# Patient Record
Sex: Female | Born: 1965
Health system: Southern US, Community
[De-identification: ages and names within clinical notes are randomized; demographics above are authoritative.]

## PROBLEM LIST (undated history)

## (undated) DIAGNOSIS — C50919 Malignant neoplasm of unspecified site of unspecified female breast: Secondary | ICD-10-CM

## (undated) DIAGNOSIS — I2699 Other pulmonary embolism without acute cor pulmonale: Secondary | ICD-10-CM

## (undated) DIAGNOSIS — I82409 Acute embolism and thrombosis of unspecified deep veins of unspecified lower extremity: Secondary | ICD-10-CM

## (undated) DIAGNOSIS — I10 Essential (primary) hypertension: Secondary | ICD-10-CM

## (undated) DIAGNOSIS — D219 Benign neoplasm of connective and other soft tissue, unspecified: Secondary | ICD-10-CM

## (undated) HISTORY — PX: BREAST SURGERY: SHX581

## (undated) HISTORY — DX: Benign neoplasm of connective and other soft tissue, unspecified: D21.9

## (undated) HISTORY — DX: Acute embolism and thrombosis of unspecified deep veins of unspecified lower extremity: I82.409

## (undated) HISTORY — DX: Other pulmonary embolism without acute cor pulmonale: I26.99

## (undated) HISTORY — DX: Essential (primary) hypertension: I10

## (undated) HISTORY — PX: MASTECTOMY: SHX3

## (undated) HISTORY — PX: BREAST LUMPECTOMY: SHX2

## (undated) HISTORY — DX: Malignant neoplasm of unspecified site of unspecified female breast: C50.919

---

## 2003-11-17 ENCOUNTER — Inpatient Hospital Stay: Payer: Self-pay

## 2003-11-17 DIAGNOSIS — I10 Essential (primary) hypertension: Secondary | ICD-10-CM

## 2006-12-23 ENCOUNTER — Ambulatory Visit: Payer: Self-pay

## 2007-10-21 ENCOUNTER — Ambulatory Visit: Payer: Self-pay | Admitting: General Surgery

## 2007-10-21 ENCOUNTER — Other Ambulatory Visit: Payer: Self-pay

## 2007-10-28 ENCOUNTER — Ambulatory Visit: Payer: Self-pay | Admitting: General Surgery

## 2007-11-08 ENCOUNTER — Ambulatory Visit: Payer: Self-pay | Admitting: General Surgery

## 2007-11-10 ENCOUNTER — Ambulatory Visit: Payer: Self-pay | Admitting: Radiation Oncology

## 2007-11-21 ENCOUNTER — Ambulatory Visit: Payer: Self-pay | Admitting: Radiation Oncology

## 2007-12-11 ENCOUNTER — Ambulatory Visit: Payer: Self-pay | Admitting: Radiation Oncology

## 2008-02-10 DIAGNOSIS — I82409 Acute embolism and thrombosis of unspecified deep veins of unspecified lower extremity: Secondary | ICD-10-CM

## 2008-02-10 DIAGNOSIS — I2699 Other pulmonary embolism without acute cor pulmonale: Secondary | ICD-10-CM

## 2008-02-10 DIAGNOSIS — C50919 Malignant neoplasm of unspecified site of unspecified female breast: Secondary | ICD-10-CM | POA: Insufficient documentation

## 2008-02-10 HISTORY — DX: Acute embolism and thrombosis of unspecified deep veins of unspecified lower extremity: I82.409

## 2008-02-10 HISTORY — PX: ABDOMINAL SURGERY: SHX537

## 2008-02-10 HISTORY — DX: Malignant neoplasm of unspecified site of unspecified female breast: C50.919

## 2008-02-10 HISTORY — DX: Other pulmonary embolism without acute cor pulmonale: I26.99

## 2008-08-30 ENCOUNTER — Ambulatory Visit: Payer: Self-pay | Admitting: Family Medicine

## 2009-01-29 IMAGING — NM NM SENTINAL NODE INJECTION (BREAST) - NO REPORT
1 series · 2 of 2 positions shown · non-contrast
Comparison: none

REASON FOR EXAM: LEFT BREAST CA
SURG AT [DATE]       [DATE]  34841
COMMENTS:

[Series 1000: sent node breast static · 2.40mm/px · 2 of 2 frames shown]
[frame 1/2]
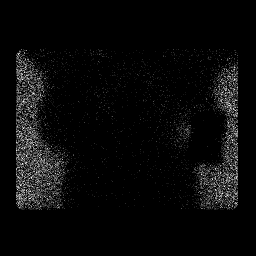
[frame 2/2]
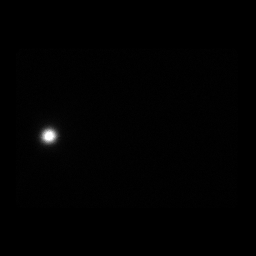

[2 of 2 positions shown; findings below may reference images not displayed]

PROCEDURE:     NM  - NM SENTINEL NODE  BREAST  - October 28, 2007  [DATE]

RESULT:     The LEFT breast is sterilely prepped and draped. Following local
anesthesia with 1% Lidocaine, 4.14 mCi of Technetium 99m Sulfur Colloid was
injected into the LEFT periareolar region for sentinel node study. There
were no complications.
IMPRESSION: Successful injection for sentinel node study.

## 2009-12-02 IMAGING — US US EXTREM LOW VENOUS BILAT
1 series · 17 of 24 positions shown · non-contrast
Comparison: none

REASON FOR EXAM: DVT left calf hx of pulmonary embolis PT takes Coumadin
COMMENTS:

[Series 1: us extrem low venous bilat · 17 of 40 slices shown]
[im 1/40]
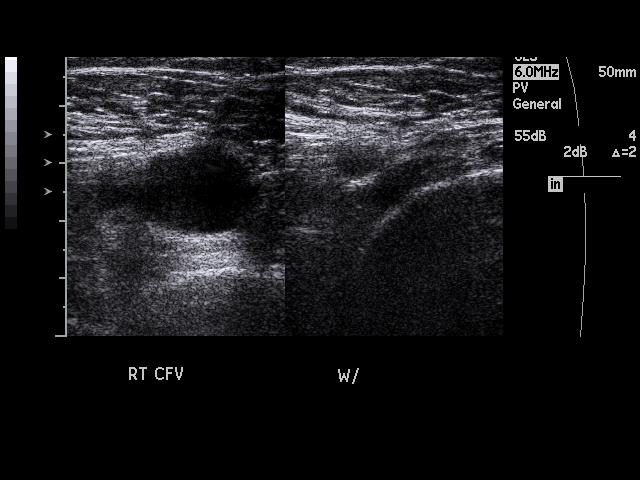
[im 4/40]
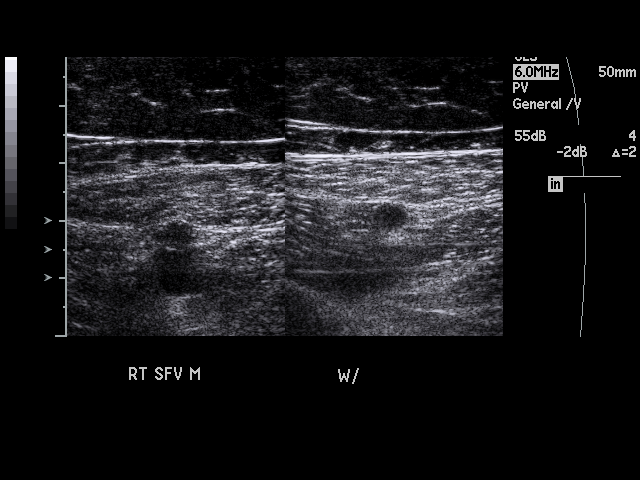
[im 6/40]
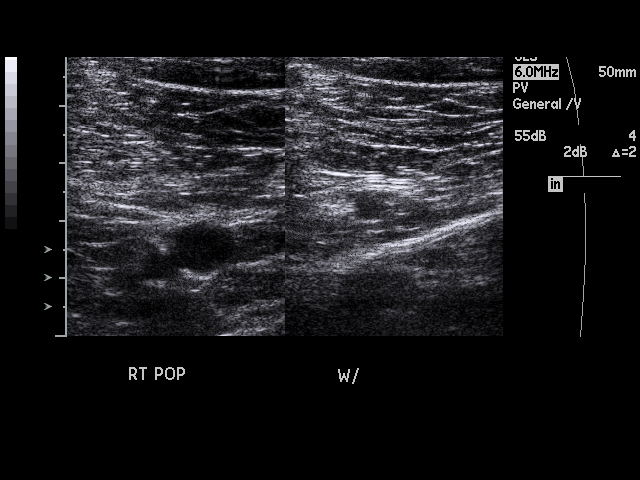
[im 7/40]
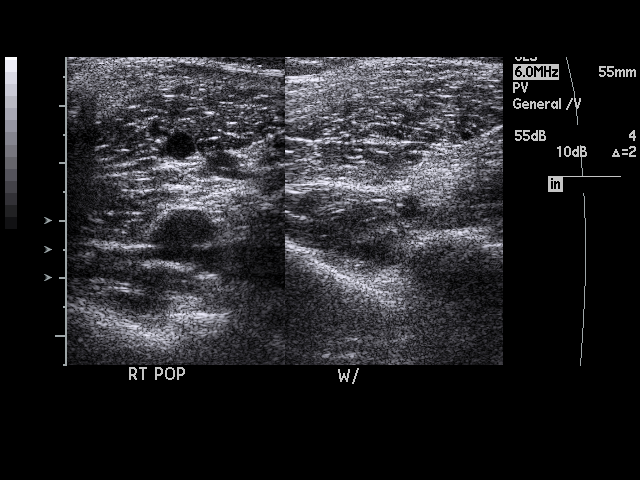
[im 11/40]
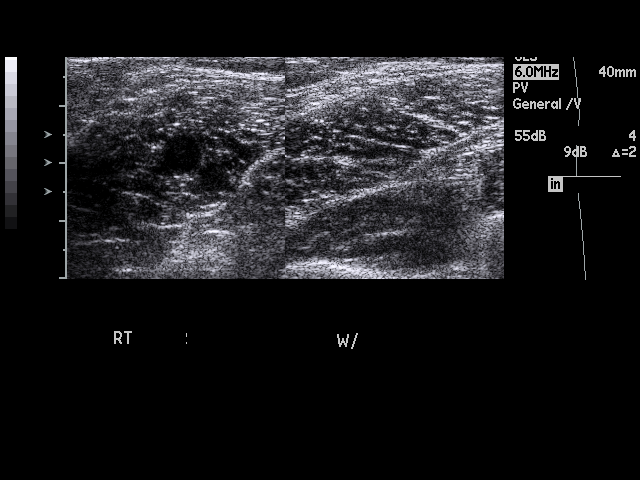
[im 12/40]
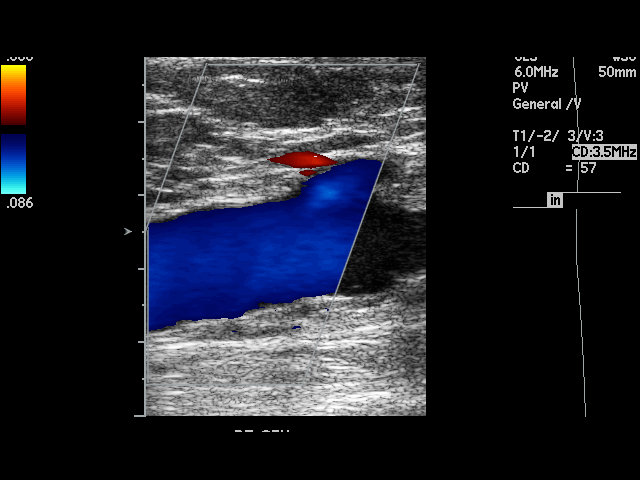
[im 16/40]
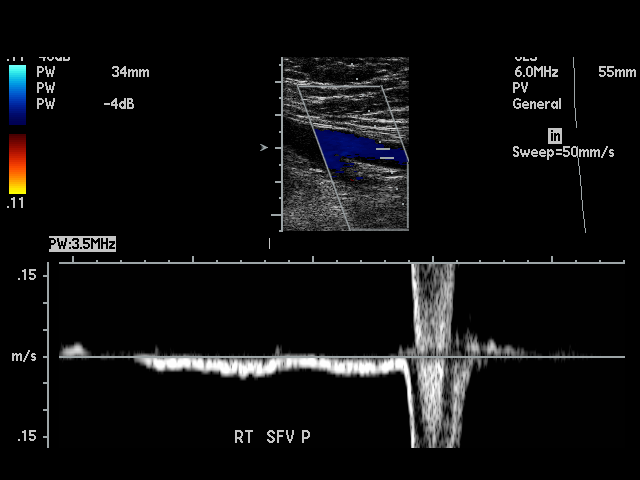
[im 17/40]
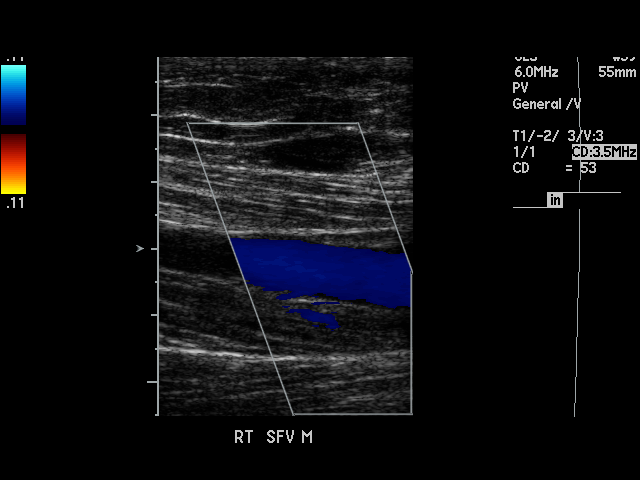
[im 21/40]
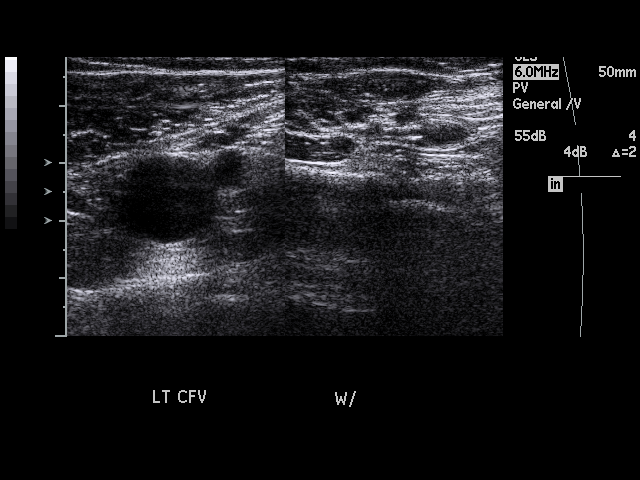
[im 23/40]
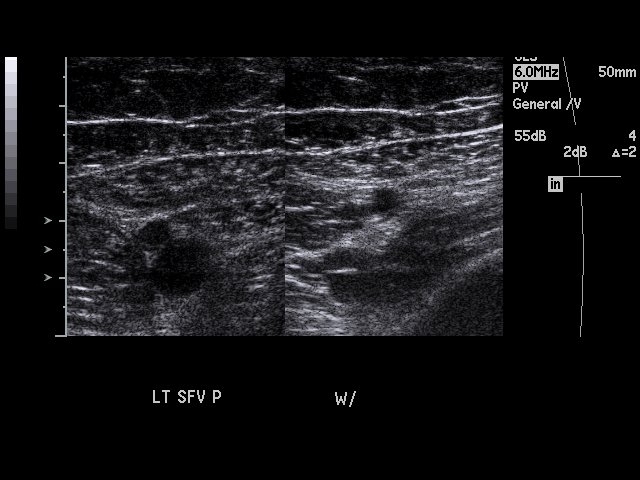
[im 24/40]
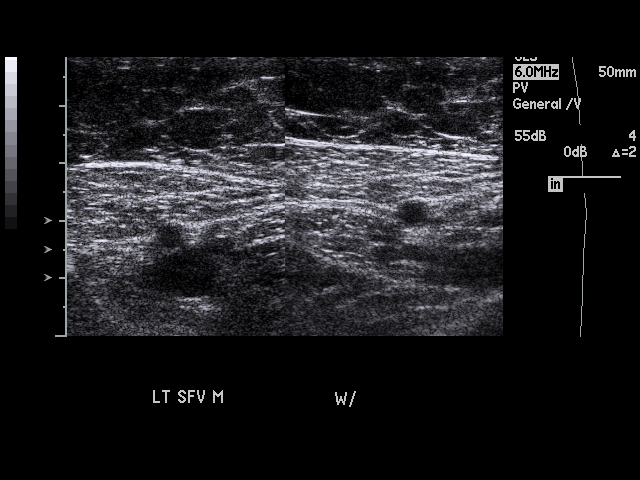
[im 28/40]
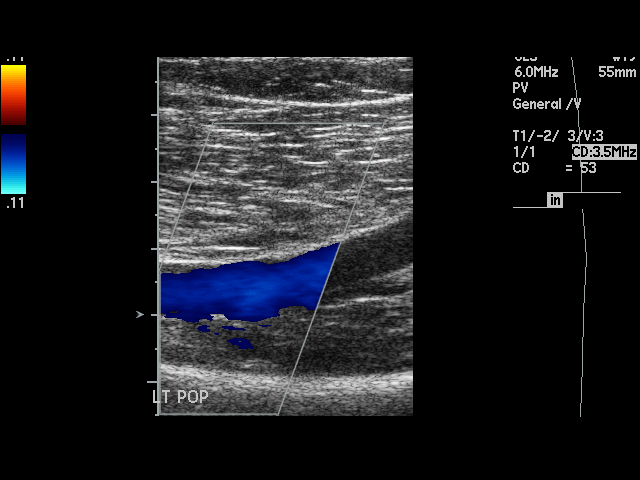
[im 29/40]
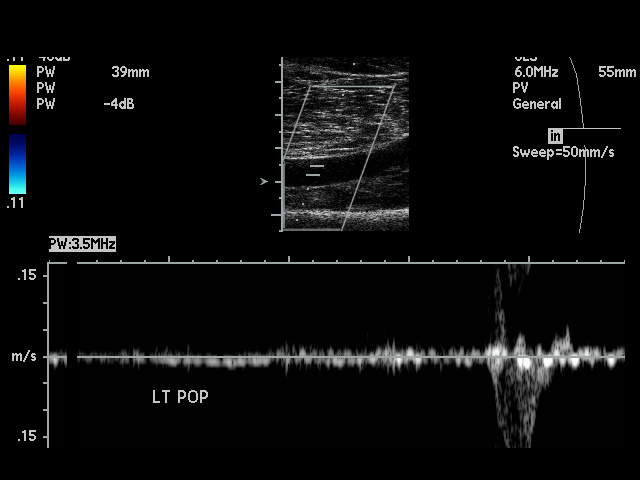
[im 33/40]
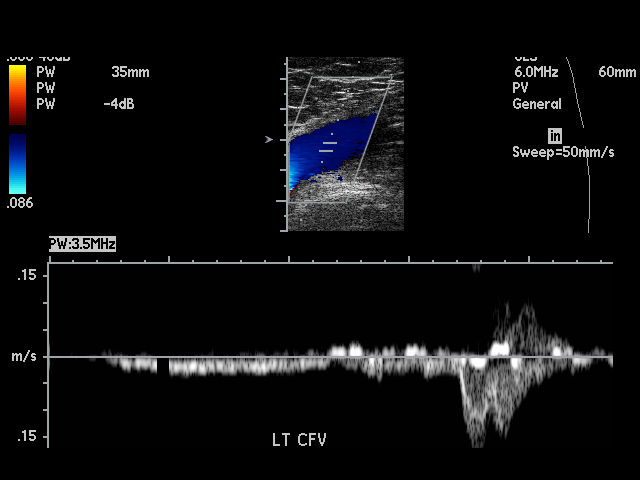
[im 34/40]
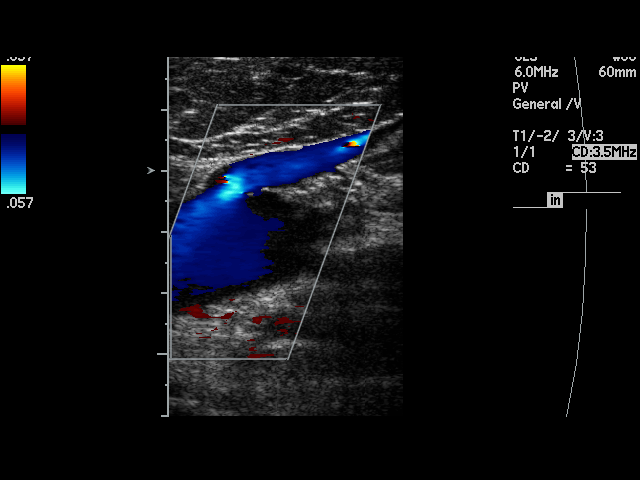
[im 36/40]
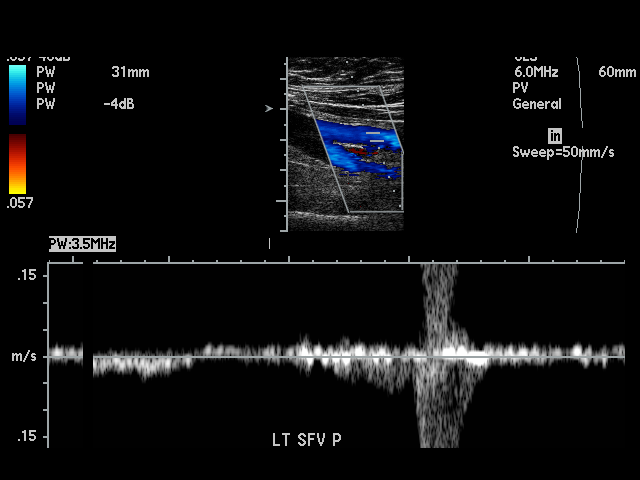
[im 40/40]
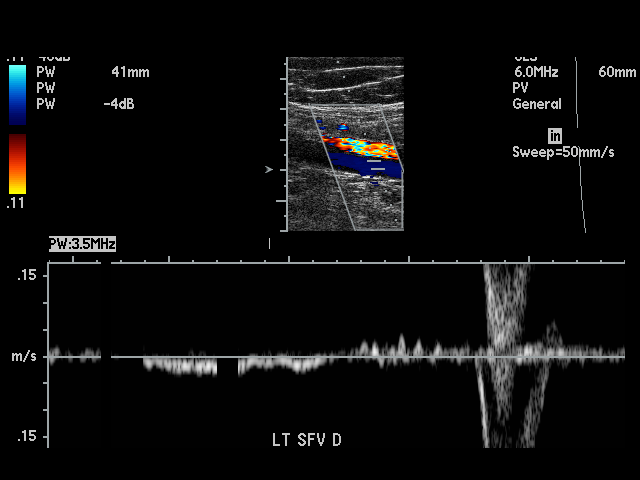

[17 of 24 positions shown; findings below may reference images not displayed]

PROCEDURE:     US  - US DOPPLER LOW EXTR BILATERAL  - August 30, 2008 [DATE]

RESULT:     The augmentation flow waveforms are normal in appearance
bilaterally. The femoral and popliteal veins bilaterally show complete
compressibility throughout their course. Bilateral Doppler examination shows
no occlusion or evidence of deep vein thrombosis.
IMPRESSION: Bilaterally normal study. No deep vein thrombosis is identified on either
side.

## 2017-06-01 ENCOUNTER — Ambulatory Visit (INDEPENDENT_AMBULATORY_CARE_PROVIDER_SITE_OTHER): Payer: 59 | Admitting: Certified Nurse Midwife

## 2017-06-01 ENCOUNTER — Encounter: Payer: Self-pay | Admitting: Certified Nurse Midwife

## 2017-06-01 VITALS — BP 138/86 | HR 88 | Ht 64.5 in | Wt 182.0 lb

## 2017-06-01 DIAGNOSIS — Z01419 Encounter for gynecological examination (general) (routine) without abnormal findings: Secondary | ICD-10-CM | POA: Diagnosis not present

## 2017-06-01 DIAGNOSIS — D259 Leiomyoma of uterus, unspecified: Secondary | ICD-10-CM

## 2017-06-01 DIAGNOSIS — Z124 Encounter for screening for malignant neoplasm of cervix: Secondary | ICD-10-CM | POA: Diagnosis not present

## 2017-06-01 NOTE — Progress Notes (Signed)
Gynecology Annual Exam  TIR:[id number] Sanjuan Dame Chief Complaint:  Chief Complaint  Patient presents with  . Gynecologic Exam    History of Present Illness:Danielle Forbes is a 52 year old African American/Black female, [name], who presents for her gyn exam. She is having no significant GYN problems.  Her menses have become more irregular over the past year and her LMP was in March . They occur every other month , they last 4-5 days , are medium flow with 1-2 heavy days requiring pad change 4-5x/day.  She has had no intermenstrual spotting.  She denies dysmenorrhea.. c  The patient's past medical history is notable for a history of left breast cancer (DCIS), left mastectomy with breast reconstruction with abdominal flap which failed and later with implant. She also has a hx of DVT and PE surrounding the surgery for her breast cancer and  hypertension She also has a large fibroid uterus and her last pelvic ultrasound in Dec 2015 showed the fibroids were basically stable: 2.66 cm ant LUS, 8.39cm in post LUS, 6.2cm in superior fundus, and a 2.5 cm fibroid on the right side. After her last annual GYN exam dated 12/19/2013, she has gained 15#. She is sexually active. She is currently using a vasectomy for contraception.  Her most recent pap smear was obtained 12/19/13 and was negative  Her most recent right mammogram obtained on at [facility] 08/03/2016 was benign There is no other family history of breast cancer.  There is no family history of ovarian cancer.  The patient does do monthly self breast exams.  The patient does not smoke.  The patient does not drink alcohol.  The patient does not use illegal drugs.  The patient does exercise by doing yardwork and mowing lawns  The patient does get adequate calcium in her diet and with her supplement  She had a recent cholesterol screen in 2018 that was borderline        Review of Systems: Review of Systems  Constitutional: Negative for  chills, fever and weight loss.  HENT: Negative for congestion, sinus pain and sore throat.   Eyes: Positive for blurred vision. Negative for pain.  Respiratory: Negative for hemoptysis, shortness of breath and wheezing.   Cardiovascular: Negative for chest pain, palpitations and leg swelling.  Gastrointestinal: Negative for abdominal pain, blood in stool, diarrhea, heartburn, nausea and vomiting.  Genitourinary: Negative for dysuria, frequency, hematuria and urgency.       Positive for irregular menses  Musculoskeletal: Negative for back pain, joint pain and myalgias.  Skin: Negative for itching and rash.  Neurological: Negative for dizziness, tingling and headaches.  Endo/Heme/Allergies: Negative for environmental allergies and polydipsia. Does not bruise/bleed easily.       Negative for hirsutism   Psychiatric/Behavioral: Negative for depression. The patient is not nervous/anxious and does not have insomnia.     Past Medical History:  Past Medical History:  Diagnosis Date  . Breast cancer ([place]) 02/2008   left breast DCIS  . DVT (deep venous thrombosis) ([place]) 02/2008  . Fibroids    leiomyoma of uterus  . Hypertension   . Pulmonary embolism ([place]) 02/2008    Past Surgical History:  Past Surgical History:  Procedure Laterality Date  . ABDOMINAL SURGERY  2010   abdominal abscess I&D after failed abdominal flap reconstruction  . BREAST LUMPECTOMY Left 02/2008 and 03/10/2011  . BREAST SURGERY     mammoplasty on right and implant on left breast  .  MASTECTOMY  left   with breast reconstruction    Family History:  Family History  Problem Relation Age of Onset  . Alcoholism Mother   . Diabetes Father   . Hypertension Father   . Diabetes Paternal Grandmother   . Hypertension Paternal Grandmother   . Diabetes Paternal Aunt   . Diabetes Paternal Aunt   . Hypertension Maternal Aunt   . Kidney failure Maternal Aunt 80    Social History:  Social History   Socioeconomic  History  . Marital status: Married    Spouse name: Not on file  . Number of children: 2  . Years of education: Not on file  . Highest education level: Not on file  Occupational History  . Occupation: Peer support  Social Needs  . Financial resource strain: Not on file  . Food insecurity:    Worry: Not on file    Inability: Not on file  . Transportation needs:    Medical: Not on file    Non-medical: Not on file  Tobacco Use  . Smoking status: Never Smoker  . Smokeless tobacco: Never Used  Substance and Sexual Activity  . Alcohol use: Never    Frequency: Never  . Drug use: Never  . Sexual activity: Yes    Partners: Male    Birth control/protection: None    Comment: vasectomy  Lifestyle  . Physical activity:    Days per week: 7 days    Minutes per session: 60 min  . Stress: Only a little  Relationships  . Social connections:    Talks on phone: Not on file    Gets together: Not on file    Attends religious service: Not on file    Active member of club or organization: Not on file    Attends meetings of clubs or organizations: Not on file    Relationship status: Not on file  . Intimate partner violence:    Fear of current or ex partner: Not on file    Emotionally abused: Not on file    Physically abused: Not on file    Forced sexual activity: Not on file  Other Topics Concern  . Not on file  Social History Narrative  . Not on file    Allergies:  No Known Allergies  Medications: Prior to Admission medications   Medication Sig Start Date End Date Taking? Authorizing Provider  ALPRAZolam (XANAX) 0.5 MG tablet TAKE 1 TABLET (0.5 MG TOTAL) BY MOUTH 2 (TWO) TIMES DAILY AS NEEDED FOR ANXIETY (1/2-1) 05/19/17  Yes [provider]  amLODipine (NORVASC) 5 MG tablet Take 5 mg by mouth daily. 05/19/17  Yes [provider]  Biotin 5 MG CAPS Take by mouth. 07/29/11  Yes [provider]  Calcium Carbonate-Vitamin D 600-400 MG-UNIT tablet Take by mouth.    Yes [provider]  Cholecalciferol (VITAMIN D3) 1000 units CAPS Take by mouth.   Yes [provider]  folic acid (FOLVITE) 1 MG tablet Take by mouth.   Yes [provider]  Multiple Vitamin (MULTI-VITAMINS) TABS Take by mouth.   Yes [provider]  triamterene-hydrochlorothiazide (MAXZIDE-25) 37.5-25 MG tablet Take 1 tablet by mouth daily. 03/13/17  Yes [provider]  zolpidem (AMBIEN) 5 MG tablet TAKE 1 TABLET (5 MG TOTAL) BY MOUTH NIGHTLY AS NEEDED FOR SLEEP 05/19/17  Yes [provider]    Physical Exam Vitals:BP 138/86   Pulse 88   Ht 5' 4.5" (1.638 m)   Wt 182 lb (  82.6 kg)   LMP 04/10/2017 (Approximate)   BMI 30.76 kg/m     General: BF in NAD HEENT: normocephalic, anicteric Neck: no thyroid enlargement, no palpable nodules, no cervical lymphadenopathy  Pulmonary: No increased work of breathing, CTAB Cardiovascular: RRR, without murmur  Breast: s/p breast reduction on right and mastectomy with implant on left. No masses palpated. Breasts non tender. No axillary, infraclavicular or supraclavicular lymphadenopathy. Abdomen: Soft, non-tender, non-distended.  Umbilicus without lesions.  No hepatomegaly. No evidence of hernia. Well healed scar on lower abdomen present. Genitourinary:  External: Normal external female genitalia.  Normal urethral meatus, normal  Bartholin's and Skene's glands.    Vagina: Normal vaginal mucosa, no evidence of prolapse.    Cervix: Grossly normal in appearance, no bleeding, non-tender  Uterus: enlarged irregular uterus extending into abdomen, immobile, NT  Adnexa: unable to assess due to large immobile uterus  Rectal: deferred  Lymphatic: no evidence of inguinal lymphadenopathy Extremities: no edema, erythema, or tenderness Neurologic: Grossly intact Psychiatric: mood appropriate, affect full     Assessment: 52 y.o. [id number] with large fibroid uterus. Hx of breast cancer: s/p left  mastectomy  Plan:   1) Breast cancer screening - recommend monthly self breast exam and right annual screening mammogram. Due after 08/03/2016 at [facility]  2) Colon cancer screening: Her PCP ordered stool for occult blood and gave her cards to collect specimens at home  3) Cervical cancer screening - Pap was done. ASCCP guidelines and rational discussed.  Patient opts for every 3-5 years screening interval  4) Contraception - vasectomy  5) Routine healthcare maintenance including cholesterol and diabetes screening managed by PCP   6) Recommend pelvic ultrasound to check stability of fibroids and to evaluate the adnexal areas. RTO in 2-3 weeks for ultrasound and follow up.  Dalia Heading, CNM

## 2017-06-04 LAB — IGP, APTIMA HPV
HPV Aptima: NEGATIVE
PAP SMEAR COMMENT: 0

## 2017-06-06 ENCOUNTER — Encounter: Payer: Self-pay | Admitting: Certified Nurse Midwife

## 2017-06-06 DIAGNOSIS — D219 Benign neoplasm of connective and other soft tissue, unspecified: Secondary | ICD-10-CM | POA: Insufficient documentation

## 2017-06-06 DIAGNOSIS — I10 Essential (primary) hypertension: Secondary | ICD-10-CM | POA: Insufficient documentation

## 2017-06-14 ENCOUNTER — Ambulatory Visit (INDEPENDENT_AMBULATORY_CARE_PROVIDER_SITE_OTHER): Payer: 59 | Admitting: Certified Nurse Midwife

## 2017-06-14 ENCOUNTER — Encounter: Payer: Self-pay | Admitting: Certified Nurse Midwife

## 2017-06-14 ENCOUNTER — Other Ambulatory Visit: Payer: Self-pay | Admitting: Certified Nurse Midwife

## 2017-06-14 ENCOUNTER — Ambulatory Visit (INDEPENDENT_AMBULATORY_CARE_PROVIDER_SITE_OTHER): Payer: 59

## 2017-06-14 VITALS — BP 132/84 | HR 83 | Ht 64.5 in | Wt 190.0 lb

## 2017-06-14 DIAGNOSIS — D259 Leiomyoma of uterus, unspecified: Secondary | ICD-10-CM | POA: Diagnosis not present

## 2017-06-14 DIAGNOSIS — D252 Subserosal leiomyoma of uterus: Secondary | ICD-10-CM | POA: Diagnosis not present

## 2017-06-14 DIAGNOSIS — D251 Intramural leiomyoma of uterus: Secondary | ICD-10-CM | POA: Diagnosis not present

## 2017-06-14 NOTE — Progress Notes (Signed)
  HPI: 52 year old AAF, G2 P2002, who has a history of a large fibroid uterus. At the time of her annual last month her uterus was large and irregular and extended into her abdomen, was immobile and it was very difficult evaluating her adnexa. At her last pelvic ultrasound in Dec 2015, the fibroids were basically stable: 2.66 cm ant LUS, 8.39cm in post LUS, 6.2cm in superior fundus, and a 2.5 cm fibroid on the right side. Her menses are irregular (every other month) and are not heavy in flow.  On today's ultrasound:  The uterus is enlarged,  retroverted and measures 16.22 x 10.62 x 11.15cm. Echo texture is heterogenous with evidence of focal masses. Within the uterus are multiple suspected fibroids measuring: Fibroid 1:  8.01 x 6.4cm, Fundal, Intramural Fibroid 2:  3.97 x 1.73cm, Anterior fundal, intramural Fibroid 3:  6.25 x 8.8cm, posterior fundal, intramural (There are likely more than 3 fibroids, but too difficult to differentiate) Thee appears to be an echogenic mass with a single feeding artery in the cervical canal suggesting a polyp. It measures 2.13 x 0.64cm.  The Endometrium measures 5.74 mm.  Right Ovary is not normal visualized. Left Ovary  is not normal visualized. Survey of the adnexa demonstrates no adnexal masses. There is no free fluid in the cul de sac.   PMHx: She  has a past medical history of Breast cancer ([place]) (02/2008), DVT (deep venous thrombosis) ([place]) (02/2008), Fibroids, Hypertension, and Pulmonary embolism ([employer]) (02/2008). Also,  has a past surgical history that includes Abdominal surgery (2010); Breast surgery; Mastectomy (left); and Breast lumpectomy (Left, 02/2008 and 03/10/2011)., family history includes Alcoholism in her mother; Diabetes in her father, paternal aunt, paternal aunt, and paternal grandmother; Hypertension in her father, maternal aunt, and paternal grandmother; Kidney failure (age of onset: 66) in her maternal aunt.,  reports that she has never  smoked. She has never used smokeless tobacco. She reports that she does not drink alcohol or use drugs.  She has a current medication list which includes the following prescription(s): alprazolam, amlodipine, biotin, calcium carbonate-vitamin d, vitamin d3, folic acid, multi-vitamins, triamterene-hydrochlorothiazide, and zolpidem. Also, has No Known Allergies.  ROS  Objective: BP 132/84   Pulse 83   Ht 5' 4.5" (1.638 m)   Wt 190 lb (86.2 kg)   BMI 32.11 kg/m   Physical examination Constitutional NAD, Conversant     Extremities: Moves all appropriately.  Normal ROM for age.  Neuro: Grossly intact  Psych: Oriented to PPT.  Normal mood. Normal affect.  Discussed results with patient   Assessment:  Large fibroid uterus-fibroids may have grown a little larger, but are asymptomatic Cervical polyp-asymptomatic  Plan: Follow up in 1 year and prn heavy, prolonged or intermenstrual bleeding  Dalia Heading, CNM

## 2017-06-30 ENCOUNTER — Encounter: Payer: Self-pay | Admitting: Obstetrics and Gynecology

## 2019-03-21 ENCOUNTER — Other Ambulatory Visit: Payer: Self-pay | Admitting: Certified Nurse Midwife

## 2019-03-21 ENCOUNTER — Telehealth: Payer: Self-pay | Admitting: Certified Nurse Midwife

## 2019-03-21 ENCOUNTER — Ambulatory Visit: Payer: BLUE CROSS/BLUE SHIELD | Admitting: Certified Nurse Midwife

## 2019-03-21 ENCOUNTER — Other Ambulatory Visit: Payer: Self-pay

## 2019-03-21 DIAGNOSIS — D252 Subserosal leiomyoma of uterus: Secondary | ICD-10-CM

## 2019-03-21 DIAGNOSIS — D251 Intramural leiomyoma of uterus: Secondary | ICD-10-CM

## 2019-03-21 NOTE — Progress Notes (Deleted)
Gynecology Annual Exam  [id number] Sanjuan Dame Chief Complaint:  No chief complaint on file.   History of Present Illness:Danielle Forbes is a 54 year old African American/Black female, [place], who presents for her gyn exam. She is having no significant GYN problems.  Her menses have become more irregular over the past year and her LMP was in March . They occur every other month , they last 4-5 days , are medium flow with 1-2 heavy days requiring pad change 4-5x/day.  She has had no intermenstrual spotting.  She denies dysmenorrhea.. c  The patient's past medical history is notable for a history of left breast cancer (DCIS), left mastectomy with breast reconstruction with abdominal flap which failed and later with implant. She also has a hx of DVT and PE surrounding the surgery for her breast cancer and  hypertension She also has a large fibroid uterus and her last pelvic ultrasound in Dec 2015 showed the fibroids were basically stable: 2.66 cm ant LUS, 8.39cm in post LUS, 6.2cm in superior fundus, and a 2.5 cm fibroid on the right side. After her last annual GYN exam dated 12/19/2013, she has gained 15#. She is sexually active. She is currently using a vasectomy for contraception.  Her most recent pap smear was obtained 12/19/13 and was negative  Her most recent right mammogram obtained on at [facility] 08/03/2016 was benign There is no other family history of breast cancer.  There is no family history of ovarian cancer.  The patient does do monthly self breast exams.  The patient does not smoke.  The patient does not drink alcohol.  The patient does not use illegal drugs.  The patient does exercise by doing yardwork and mowing lawns  The patient does get adequate calcium in her diet and with her supplement  She had a recent cholesterol screen in 2018 that was borderline        Review of Systems: Review of Systems  Constitutional: Negative for chills, fever and weight loss.    HENT: Negative for congestion, sinus pain and sore throat.   Eyes: Positive for blurred vision. Negative for pain.  Respiratory: Negative for hemoptysis, shortness of breath and wheezing.   Cardiovascular: Negative for chest pain, palpitations and leg swelling.  Gastrointestinal: Negative for abdominal pain, blood in stool, diarrhea, heartburn, nausea and vomiting.  Genitourinary: Negative for dysuria, frequency, hematuria and urgency.       Positive for irregular menses  Musculoskeletal: Negative for back pain, joint pain and myalgias.  Skin: Negative for itching and rash.  Neurological: Negative for dizziness, tingling and headaches.  Endo/Heme/Allergies: Negative for environmental allergies and polydipsia. Does not bruise/bleed easily.       Negative for hirsutism   Psychiatric/Behavioral: Negative for depression. The patient is not nervous/anxious and does not have insomnia.     Past Medical History:  Past Medical History:  Diagnosis Date  . Breast cancer ([place]) 02/2008   left breast DCIS; 5/19 cancer genetic testing letter sent  . DVT (deep venous thrombosis) ([place]) 02/2008  . Fibroids    leiomyoma of uterus  . Hypertension   . Pulmonary embolism ([place]) 02/2008    Past Surgical History:  Past Surgical History:  Procedure Laterality Date  . ABDOMINAL SURGERY  2010   abdominal abscess I&D after failed abdominal flap reconstruction  . BREAST LUMPECTOMY Left 02/2008 and 03/10/2011  . BREAST SURGERY     mammoplasty on right and implant on left breast  .  MASTECTOMY  left   with breast reconstruction    Family History:  Family History  Problem Relation Age of Onset  . Alcoholism Mother   . Diabetes Father   . Hypertension Father   . Diabetes Paternal Grandmother   . Hypertension Paternal Grandmother   . Diabetes Paternal Aunt   . Diabetes Paternal Aunt   . Hypertension Maternal Aunt   . Kidney failure Maternal Aunt 80    Social History:  Social History    Socioeconomic History  . Marital status: Married    Spouse name: Not on file  . Number of children: 2  . Years of education: Not on file  . Highest education level: Not on file  Occupational History  . Occupation: Peer support  Tobacco Use  . Smoking status: Never Smoker  . Smokeless tobacco: Never Used  Substance and Sexual Activity  . Alcohol use: Never  . Drug use: Never  . Sexual activity: Yes    Partners: Male    Birth control/protection: None    Comment: vasectomy  Other Topics Concern  . Not on file  Social History Narrative  . Not on file   Social Determinants of Health   Financial Resource Strain:   . Difficulty of Paying Living Expenses: Not on file  Food Insecurity:   . Worried About Charity fundraiser in the Last Year: Not on file  . Ran Out of Food in the Last Year: Not on file  Transportation Needs:   . Lack of Transportation (Medical): Not on file  . Lack of Transportation (Non-Medical): Not on file  Physical Activity:   . Days of Exercise per Week: Not on file  . Minutes of Exercise per Session: Not on file  Stress:   . Feeling of Stress : Not on file  Social Connections:   . Frequency of Communication with Friends and Family: Not on file  . Frequency of Social Gatherings with Friends and Family: Not on file  . Attends Religious Services: Not on file  . Active Member of Clubs or Organizations: Not on file  . Attends Archivist Meetings: Not on file  . Marital Status: Not on file  Intimate Partner Violence:   . Fear of Current or Ex-Partner: Not on file  . Emotionally Abused: Not on file  . Physically Abused: Not on file  . Sexually Abused: Not on file    Allergies:  No Known Allergies  Medications: Prior to Admission medications   Medication Sig Start Date End Date Taking? Authorizing Provider  ALPRAZolam (XANAX) 0.5 MG tablet TAKE 1 TABLET (0.5 MG TOTAL) BY MOUTH 2 (TWO) TIMES DAILY AS NEEDED FOR ANXIETY (1/2-1) 05/19/17  Yes  [provider]  amLODipine (NORVASC) 5 MG tablet Take 5 mg by mouth daily. 05/19/17  Yes [provider]  Biotin 5 MG CAPS Take by mouth. 07/29/11  Yes [provider]  Calcium Carbonate-Vitamin D 600-400 MG-UNIT tablet Take by mouth.   Yes [provider]  Cholecalciferol (VITAMIN D3) 1000 units CAPS Take by mouth.   Yes [provider]  folic acid (FOLVITE) 1 MG tablet Take by mouth.   Yes [provider]  Multiple Vitamin (MULTI-VITAMINS) TABS Take by mouth.   Yes [provider]  triamterene-hydrochlorothiazide (MAXZIDE-25) 37.5-25 MG tablet Take 1 tablet by mouth daily. 03/13/17  Yes [provider]  zolpidem (AMBIEN) 5 MG tablet TAKE 1 TABLET (5 MG TOTAL) BY MOUTH NIGHTLY AS NEEDED FOR SLEEP 05/19/17  Yes [provider]    Physical Exam Vitals:There were no vitals taken for this visit.    General: BF in NAD HEENT: normocephalic, anicteric Neck: no thyroid enlargement, no palpable nodules, no cervical lymphadenopathy  Pulmonary: No increased work of breathing, CTAB Cardiovascular: RRR, without murmur  Breast: s/p breast reduction on right and mastectomy with implant on left. No masses palpated. Breasts non tender. No axillary, infraclavicular or supraclavicular lymphadenopathy. Abdomen: Soft, non-tender, non-distended.  Umbilicus without lesions.  No hepatomegaly. No evidence of hernia. Well healed scar on lower abdomen present. Genitourinary:  External: Normal external female genitalia.  Normal urethral meatus, normal  Bartholin's and Skene's glands.    Vagina: Normal vaginal mucosa, no evidence of prolapse.    Cervix: Grossly normal in appearance, no bleeding, non-tender  Uterus: enlarged irregular uterus extending into abdomen, immobile, NT  Adnexa: unable to assess due to large immobile uterus  Rectal: deferred  Lymphatic: no evidence of inguinal lymphadenopathy Extremities: no edema, erythema, or  tenderness Neurologic: Grossly intact Psychiatric: mood appropriate, affect full     Assessment: 54 y.o. [id number] with large fibroid uterus. Hx of breast cancer: s/p left mastectomy  Plan:   1) Breast cancer screening - recommend monthly self breast exam and right annual screening mammogram. Due after 08/03/2016 at [facility]  2) Colon cancer screening: Her PCP ordered stool for occult blood and gave her cards to collect specimens at home  3) Cervical cancer screening - Pap was done. ASCCP guidelines and rational discussed.  Patient opts for every 3-5 years screening interval  4) Contraception - vasectomy  5) Routine healthcare maintenance including cholesterol and diabetes screening managed by PCP   6) Recommend pelvic ultrasound to check stability of fibroids and to evaluate the adnexal areas. RTO in 2-3 weeks for ultrasound and follow up.  Dalia Heading, CNM

## 2019-03-21 NOTE — Telephone Encounter (Signed)
We can certainly schedule an ultrasound to assess growth with her annual. Will order

## 2019-03-21 NOTE — Telephone Encounter (Signed)
Patient has rescheduled her annual out to March due to having the Danielle Forbes which we do not accept, she is considered self pay.  She states she has had ultrasound in the past to keep an eye on issues and wants to know if she needs to schedule this along with her annual?  Please advise patient if this is the case.

## 2019-03-21 NOTE — Telephone Encounter (Signed)
Patient has rescheduled her annual out to March due to having the [place], was not aware at time of scheduling, but now aware and does want to be seen with CLG.  Patient aware 305.00 due at time of service.

## 2019-03-22 NOTE — Telephone Encounter (Signed)
Scheduled

## 2019-04-18 ENCOUNTER — Other Ambulatory Visit: Payer: BLUE CROSS/BLUE SHIELD

## 2019-04-18 ENCOUNTER — Ambulatory Visit: Payer: BLUE CROSS/BLUE SHIELD | Admitting: Certified Nurse Midwife

## 2019-04-28 ENCOUNTER — Ambulatory Visit: Payer: Self-pay | Attending: Internal Medicine

## 2019-04-28 DIAGNOSIS — Z23 Encounter for immunization: Secondary | ICD-10-CM

## 2019-04-28 NOTE — Progress Notes (Signed)
   Covid-19 Vaccination Clinic  Name:  Danielle Forbes    MRN: [id number] DOB: 1965-09-11  04/28/2019  [name] was observed post Covid-19 immunization for 15 minutes without incident. She was provided with Vaccine Information Sheet and instruction to access the V-Safe system.   [name] was instructed to call 911 with any severe reactions post vaccine: Marland Kitchen Difficulty breathing  . Swelling of face and throat  . A fast heartbeat  . A bad rash all over body  . Dizziness and weakness   Immunizations Administered    Name Date Dose VIS Date Route   Pfizer COVID-19 Vaccine 04/28/2019  5:57 PM 0.3 mL 01/20/2019 Intramuscular   Manufacturer: [place]   Lot: [id number]   [place]: [id number]

## 2019-05-19 ENCOUNTER — Ambulatory Visit: Payer: Self-pay | Attending: Internal Medicine

## 2019-05-19 DIAGNOSIS — Z23 Encounter for immunization: Secondary | ICD-10-CM

## 2019-05-19 NOTE — Progress Notes (Signed)
   Covid-19 Vaccination Clinic  Name:  Danielle Forbes    MRN: [id number] DOB: Sep 13, 1965  05/19/2019  [name] was observed post Covid-19 immunization for 15 minutes without incident. She was provided with Vaccine Information Sheet and instruction to access the V-Safe system.   [name] was instructed to call 911 with any severe reactions post vaccine: Marland Kitchen Difficulty breathing  . Swelling of face and throat  . A fast heartbeat  . A bad rash all over body  . Dizziness and weakness   Immunizations Administered    Name Date Dose VIS Date Route   Pfizer COVID-19 Vaccine 05/19/2019  4:02 PM 0.3 mL 01/20/2019 Intramuscular   Manufacturer: [place]   Lot: [id number]   [place]: [id number]

## 2021-01-30 LAB — EXTERNAL GENERIC LAB PROCEDURE: COLOGUARD: NEGATIVE

## 2021-04-29 ENCOUNTER — Other Ambulatory Visit: Payer: Self-pay

## 2021-04-29 ENCOUNTER — Ambulatory Visit (LOCAL_COMMUNITY_HEALTH_CENTER): Payer: Self-pay

## 2021-04-29 DIAGNOSIS — Z719 Counseling, unspecified: Secondary | ICD-10-CM

## 2021-04-29 DIAGNOSIS — Z23 Encounter for immunization: Secondary | ICD-10-CM

## 2021-04-29 NOTE — Progress Notes (Signed)
?  Are you feeling sick today? No ? ? ?Have you ever received a dose of COVID-19 Vaccine? [employer], [place], [place], [employer], Other) Yes ? ?If yes, which vaccine and how many doses?   Pfizer Primary series 2 doses, Pfizer monovalent booster 1 dose ? ? ?Did you bring the vaccination record card or other documentation?  Yes ? ? ?Do you have a health condition or are undergoing treatment that makes you moderately or severely immunocompromised? This would include, but not be limited to: cancer, HIV, organ transplant, immunosuppressive therapy/high-dose corticosteroids, or moderate/severe primary immunodeficiency.  No ? ?Have you received COVID-19 vaccine before or during hematopoietic cell transplant (HCT) or CAR-T-cell therapies? No ? ?Have you ever had an allergic reaction to: (This would include a severe allergic reaction or a reaction that caused hives, swelling, or respiratory distress, including wheezing.) A component of a COVID-19 vaccine or a previous dose of COVID-19 vaccine? No ? ? ?Have you ever had an allergic reaction to another vaccine (other thanCOVID-19 vaccine) or an injectable medication? (This would include a severe allergic reaction or a reaction that caused hives, swelling, or respiratory distress, including wheezing.)   No ?  ?Do you have a history of any of the following: ? ?Myocarditis or Pericarditis No ?Thrombosis with thrombocytopenia syndrome (TTS) No ?Multisystem Inflammatory Syndrome (MIS-C or MIS-A)? No ?Immune-mediate syndrome defined by thrombosis and thrombocytopenia, such as heparin--induced thrombocytopenia (HIT)  No ?Guillain-Barr? Syndrome (GBS) No ?COVID-19 disease within the past 3 months? No ?Vaccinated with monkeypox vaccine in the last 4 weeks? No ? ?[facility] and COVID card updated and copy given to patient. Ranae Palms, RN ? ? ? ?

## 2022-03-17 DIAGNOSIS — E782 Mixed hyperlipidemia: Secondary | ICD-10-CM | POA: Diagnosis not present

## 2022-03-17 DIAGNOSIS — I10 Essential (primary) hypertension: Secondary | ICD-10-CM | POA: Diagnosis not present

## 2022-03-17 DIAGNOSIS — R7303 Prediabetes: Secondary | ICD-10-CM | POA: Diagnosis not present

## 2022-03-25 DIAGNOSIS — Z1231 Encounter for screening mammogram for malignant neoplasm of breast: Secondary | ICD-10-CM | POA: Diagnosis not present

## 2022-03-25 DIAGNOSIS — E782 Mixed hyperlipidemia: Secondary | ICD-10-CM | POA: Diagnosis not present

## 2022-03-25 DIAGNOSIS — R7303 Prediabetes: Secondary | ICD-10-CM | POA: Diagnosis not present

## 2022-03-25 DIAGNOSIS — C50912 Malignant neoplasm of unspecified site of left female breast: Secondary | ICD-10-CM | POA: Diagnosis not present

## 2022-03-25 DIAGNOSIS — I10 Essential (primary) hypertension: Secondary | ICD-10-CM | POA: Diagnosis not present

## 2022-03-26 DIAGNOSIS — L089 Local infection of the skin and subcutaneous tissue, unspecified: Secondary | ICD-10-CM | POA: Diagnosis not present

## 2022-03-26 DIAGNOSIS — L668 Other cicatricial alopecia: Secondary | ICD-10-CM | POA: Diagnosis not present

## 2022-04-14 DIAGNOSIS — Z1231 Encounter for screening mammogram for malignant neoplasm of breast: Secondary | ICD-10-CM | POA: Diagnosis not present

## 2022-09-16 DIAGNOSIS — C50912 Malignant neoplasm of unspecified site of left female breast: Secondary | ICD-10-CM | POA: Diagnosis not present

## 2022-09-16 DIAGNOSIS — R8281 Pyuria: Secondary | ICD-10-CM | POA: Diagnosis not present

## 2022-09-16 DIAGNOSIS — E782 Mixed hyperlipidemia: Secondary | ICD-10-CM | POA: Diagnosis not present

## 2022-09-16 DIAGNOSIS — Z0000 Encounter for general adult medical examination without abnormal findings: Secondary | ICD-10-CM | POA: Diagnosis not present

## 2022-09-16 DIAGNOSIS — I10 Essential (primary) hypertension: Secondary | ICD-10-CM | POA: Diagnosis not present

## 2022-09-16 DIAGNOSIS — R7303 Prediabetes: Secondary | ICD-10-CM | POA: Diagnosis not present

## 2022-12-19 ENCOUNTER — Ambulatory Visit: Payer: Self-pay

## 2022-12-19 DIAGNOSIS — Z23 Encounter for immunization: Secondary | ICD-10-CM

## 2023-01-28 DIAGNOSIS — L6681 Central centrifugal cicatricial alopecia: Secondary | ICD-10-CM | POA: Diagnosis not present

## 2023-01-28 DIAGNOSIS — L658 Other specified nonscarring hair loss: Secondary | ICD-10-CM | POA: Diagnosis not present

## 2023-01-28 DIAGNOSIS — L6689 Other cicatricial alopecia: Secondary | ICD-10-CM | POA: Diagnosis not present

## 2023-01-28 DIAGNOSIS — L089 Local infection of the skin and subcutaneous tissue, unspecified: Secondary | ICD-10-CM | POA: Diagnosis not present

## 2023-03-16 DIAGNOSIS — Z1231 Encounter for screening mammogram for malignant neoplasm of breast: Secondary | ICD-10-CM | POA: Diagnosis not present

## 2023-03-16 DIAGNOSIS — I10 Essential (primary) hypertension: Secondary | ICD-10-CM | POA: Diagnosis not present

## 2023-03-16 DIAGNOSIS — E782 Mixed hyperlipidemia: Secondary | ICD-10-CM | POA: Diagnosis not present

## 2023-03-16 DIAGNOSIS — C50912 Malignant neoplasm of unspecified site of left female breast: Secondary | ICD-10-CM | POA: Diagnosis not present

## 2023-03-16 DIAGNOSIS — R8290 Unspecified abnormal findings in urine: Secondary | ICD-10-CM | POA: Diagnosis not present

## 2023-03-16 DIAGNOSIS — R7303 Prediabetes: Secondary | ICD-10-CM | POA: Diagnosis not present

## 2023-04-27 DIAGNOSIS — Z1231 Encounter for screening mammogram for malignant neoplasm of breast: Secondary | ICD-10-CM | POA: Diagnosis not present
